# Patient Record
Sex: Female | Born: 1955 | Race: Black or African American | Hispanic: No | Smoking: Former smoker
Health system: Southern US, Community
[De-identification: ages and names within clinical notes are randomized; demographics above are authoritative.]

## PROBLEM LIST (undated history)

## (undated) DIAGNOSIS — G5600 Carpal tunnel syndrome, unspecified upper limb: Secondary | ICD-10-CM

## (undated) DIAGNOSIS — T7840XA Allergy, unspecified, initial encounter: Secondary | ICD-10-CM

## (undated) DIAGNOSIS — K279 Peptic ulcer, site unspecified, unspecified as acute or chronic, without hemorrhage or perforation: Secondary | ICD-10-CM

## (undated) DIAGNOSIS — E7800 Pure hypercholesterolemia, unspecified: Secondary | ICD-10-CM

## (undated) HISTORY — PX: TOE SURGERY: SHX1073

## (undated) HISTORY — PX: ABDOMINAL HYSTERECTOMY: SHX81

## (undated) HISTORY — DX: Peptic ulcer, site unspecified, unspecified as acute or chronic, without hemorrhage or perforation: K27.9

## (undated) HISTORY — DX: Allergy, unspecified, initial encounter: T78.40XA

---

## 2009-09-27 ENCOUNTER — Emergency Department (HOSPITAL_BASED_OUTPATIENT_CLINIC_OR_DEPARTMENT_OTHER): Admission: EM | Admit: 2009-09-27 | Discharge: 2009-09-27 | Payer: Self-pay | Admitting: Emergency Medicine

## 2009-09-27 ENCOUNTER — Ambulatory Visit: Payer: Self-pay | Admitting: Radiology

## 2012-06-06 LAB — HM PAP SMEAR

## 2012-09-06 ENCOUNTER — Emergency Department (HOSPITAL_BASED_OUTPATIENT_CLINIC_OR_DEPARTMENT_OTHER): Payer: 59

## 2012-09-06 ENCOUNTER — Emergency Department (HOSPITAL_BASED_OUTPATIENT_CLINIC_OR_DEPARTMENT_OTHER)
Admission: EM | Admit: 2012-09-06 | Discharge: 2012-09-06 | Disposition: A | Discharge status: Home / Self Care | Payer: 59 | Attending: Emergency Medicine | Admitting: Emergency Medicine

## 2012-09-06 ENCOUNTER — Encounter (HOSPITAL_BASED_OUTPATIENT_CLINIC_OR_DEPARTMENT_OTHER): Payer: Self-pay | Admitting: *Deleted

## 2012-09-06 DIAGNOSIS — M5416 Radiculopathy, lumbar region: Secondary | ICD-10-CM

## 2012-09-06 DIAGNOSIS — Z87891 Personal history of nicotine dependence: Secondary | ICD-10-CM | POA: Insufficient documentation

## 2012-09-06 DIAGNOSIS — IMO0002 Reserved for concepts with insufficient information to code with codable children: Secondary | ICD-10-CM | POA: Insufficient documentation

## 2012-09-06 DIAGNOSIS — G5600 Carpal tunnel syndrome, unspecified upper limb: Secondary | ICD-10-CM | POA: Insufficient documentation

## 2012-09-06 DIAGNOSIS — Z8639 Personal history of other endocrine, nutritional and metabolic disease: Secondary | ICD-10-CM | POA: Insufficient documentation

## 2012-09-06 DIAGNOSIS — Z862 Personal history of diseases of the blood and blood-forming organs and certain disorders involving the immune mechanism: Secondary | ICD-10-CM | POA: Insufficient documentation

## 2012-09-06 DIAGNOSIS — Z8711 Personal history of peptic ulcer disease: Secondary | ICD-10-CM | POA: Insufficient documentation

## 2012-09-06 DIAGNOSIS — M5412 Radiculopathy, cervical region: Secondary | ICD-10-CM | POA: Insufficient documentation

## 2012-09-06 HISTORY — DX: Carpal tunnel syndrome, unspecified upper limb: G56.00

## 2012-09-06 HISTORY — DX: Pure hypercholesterolemia, unspecified: E78.00

## 2012-09-06 MED ORDER — METHYLPREDNISOLONE 4 MG PO KIT: PACK | ORAL | Status: DC

## 2012-09-06 MED ORDER — MELOXICAM 7.5 MG PO TABS: 7.5000 mg | ORAL_TABLET | Freq: Every day | ORAL | Status: DC

## 2012-09-06 NOTE — ED Provider Notes (Signed)
History     CSN: [id number]  Arrival date & time 09/06/12  1031   First MD Initiated Contact with Patient 09/06/12 1046      Chief Complaint  Patient presents with  . Claudication    (Consider location/radiation/quality/duration/timing/severity/associated sxs/prior treatment) Patient is a 57 y.o. female presenting with leg pain. The history is provided by the patient.  Leg Pain Location:  Leg Time since incident:  12 hours Injury: no   Leg location:  R leg and R lower leg Pain details:    Quality:  Aching, pressure and tingling   Radiates to: to the toes and foot.   Severity:  Moderate   Onset quality:  Gradual   Duration:  12 hours   Timing:  Constant   Progression:  Worsening Chronicity:  New Prior injury to area:  No Relieved by:  Nothing Worsened by:  Bearing weight Ineffective treatments:  Elevation, movement and rest Associated symptoms: tingling   Associated symptoms: no back pain, no decreased ROM, no fever, no muscle weakness, no neck pain and no swelling     Past Medical History  Diagnosis Date  . High cholesterol   . Carpal tunnel syndrome     Past Surgical History  Procedure Laterality Date  . Abdominal hysterectomy    . Toe surgery      R foot    History reviewed. No pertinent family history.  History  Substance Use Topics  . Smoking status: Never Smoker   . Smokeless tobacco: Not on file  . Alcohol Use: Yes    OB History   Grav Para Term Preterm Abortions TAB SAB Ect Mult Living                  Review of Systems  Constitutional: Negative for fever.  HENT: Negative for neck pain.   Musculoskeletal: Negative for back pain.  All other systems reviewed and are negative.    Allergies  Review of patient's allergies indicates no known allergies.  Home Medications   Current Outpatient Rx  Name  Route  Sig  Dispense  Refill  . Multiple Vitamin (MULTIVITAMIN) tablet   Oral   Take 1 tablet by mouth daily.           BP 142/94   Pulse 85  Temp(Src) 98.3 F (36.8 C) (Oral)  Resp 16  Ht 5\' 8"  (1.727 m)  Wt 230 lb (104.327 kg)  BMI 34.98 kg/m2  SpO2 100%  Physical Exam  Nursing note and vitals reviewed. Constitutional: She is oriented to person, place, and time. She appears well-developed and well-nourished. No distress.  Pt upset and tearful on exam states she is just scared  HENT:  Head: Normocephalic and atraumatic.  Mouth/Throat: Oropharynx is clear and moist.  Eyes: Conjunctivae and EOM are normal. Pupils are equal, round, and reactive to light.  Neck: Normal range of motion. Neck supple.  Cardiovascular: Normal rate, regular rhythm and intact distal pulses.   No murmur heard. Pulmonary/Chest: Effort normal and breath sounds normal. No respiratory distress. She has no wheezes. She has no rales.  Abdominal: Soft. She exhibits no distension. There is no tenderness. There is no rebound and no guarding.  Musculoskeletal: Normal range of motion. She exhibits no edema and no tenderness.       Lumbar back: Normal.  Wrist brace on the right wrist for carpal tunnel  Neurological: She is alert and oriented to person, place, and time. She has normal strength. A sensory deficit is present.  Mild subjective decreased sensation in the right leg below the calf  Skin: Skin is warm and dry. No rash noted. No erythema.  Psychiatric: She has a normal mood and affect. Her behavior is normal.    ED Course  Procedures (including critical care time)  Labs Reviewed - No data to display Dg Lumbar Spine Complete  09/06/2012   *RADIOLOGY REPORT*  Clinical Data: Right lower leg pain/numbness  LUMBAR SPINE - COMPLETE 4+ VIEW  Comparison: None.  Findings: Five lumbar-type vertebral bodies.  Normal lumbar lordosis.  No evidence of fracture or dislocation.  Vertebral body heights are maintained.  Mild to moderate multilevel degenerative changes.  Visualized bony pelvis appears intact.  IMPRESSION: No fracture or dislocation is seen.   Mild to moderate multilevel degenerative changes.   Original Report Authenticated By: Charline Bills, M.D.   Ct Cervical Spine Wo Contrast  09/06/2012   *RADIOLOGY REPORT*  Clinical Data: Neck/right arm pain, right leg numbness  CT CERVICAL SPINE WITHOUT CONTRAST  Technique:  Multidetector CT imaging of the cervical spine was performed. Multiplanar CT image reconstructions were also generated.  Comparison: None.  Findings: Reversal of the normal cervical lordosis.  No evidence of fracture or dislocation.  Vertebral body heights are maintained.  The dens appears intact.  No prevertebral soft tissue swelling.  Moderate multilevel degenerative changes, most prominent at C5-6 and C6-7.  Osteophytic spurring which indents the right anterior thecal sac at C6 (series 2/image 52).  Visualized thyroid is unremarkable.  Small bilateral cervical lymph nodes measuring up to 7 mm short axis, likely reactive.  Visualized lung apices are clear.  IMPRESSION: No fracture or dislocation is seen.  Moderate multilevel degenerative changes, most prominent C5-6 and C6-7.  Osteophytic spurring which indents the right anterior thecal sac at C6.   Original Report Authenticated By: Charline Bills, M.D.   US Venous Img Lower Unilateral Right  09/06/2012   *RADIOLOGY REPORT*  Clinical Data: LEG NUMBNESS FOR APPROXIMATELY 1 YEAR.  RIGHT LOWER EXTREMITY VENOUS DUPLEX ULTRASOUND  Technique: Gray-scale sonography with compression, as well as color and duplex ultrasound, were performed to evaluate the deep venous system from the level of the common femoral vein through the popliteal and proximal calf veins.  Comparison: None  Findings:  Normal compressibility and normal Doppler signal within the common femoral, superficial femoral and popliteal veins, down to the proximal calf veins.  No grayscale filling defects to suggest DVT.  IMPRESSION: No evidence of right lower extremity deep vein thrombosis.   Original Report Authenticated By: Jeronimo Greaves, M.D.     1. Cervical radiculopathy at C6   2. Lumbar radiculopathy, acute       MDM   Patient presenting with right calf pain and leg numbness that started last night and has continued despite rubbing her leg, changing positions or elevation. She denies any risk factors for DVT but does have calf tenderness. Exam she has slight decreased sensation on the right subjectively compared to the left. Normal strength bilaterally she denies any bowel or bladder issues and denies any prior history of back problems. Patient also has been having issues with her right upper arm with neck pain and pain radiating down her arm. She said she was told she had carpal tunnel however she's had a lot of arm pain as well. She's not describing any numbness or tingling in her arm or her face. She is able to ambulate without difficulty. Or signs concerning for stroke at this time however concern for  lumbar radiculopathy versus DVT. Also given her neck complaints we'll do a CT of her neck to evaluate for degenerative changes.  CT C-spine, lumbar films and duplex of the right lower strandy pending. At this time patient did not want any pain medication and refused ibuprofen  12:25 PM NO DVT.  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  Pt given medrol dose pack and meloxicam and f/u.      Gwyneth Sprout, MD 09/06/12 1226

## 2012-09-06 NOTE — ED Notes (Signed)
patinet c/o R leg calf pain since this  morning

## 2012-09-10 ENCOUNTER — Other Ambulatory Visit: Payer: Self-pay | Admitting: Family Medicine

## 2012-09-10 ENCOUNTER — Encounter: Payer: Self-pay | Admitting: Family Medicine

## 2012-09-10 ENCOUNTER — Ambulatory Visit (INDEPENDENT_AMBULATORY_CARE_PROVIDER_SITE_OTHER): Payer: 59 | Admitting: Family Medicine

## 2012-09-10 VITALS — BP 138/83 | HR 74 | Wt 226.0 lb

## 2012-09-10 DIAGNOSIS — Z1231 Encounter for screening mammogram for malignant neoplasm of breast: Secondary | ICD-10-CM

## 2012-09-10 DIAGNOSIS — M542 Cervicalgia: Secondary | ICD-10-CM

## 2012-09-10 DIAGNOSIS — E559 Vitamin D deficiency, unspecified: Secondary | ICD-10-CM

## 2012-09-10 DIAGNOSIS — E785 Hyperlipidemia, unspecified: Secondary | ICD-10-CM

## 2012-09-10 DIAGNOSIS — R7301 Impaired fasting glucose: Secondary | ICD-10-CM

## 2012-09-10 DIAGNOSIS — L709 Acne, unspecified: Secondary | ICD-10-CM

## 2012-09-10 DIAGNOSIS — R5381 Other malaise: Secondary | ICD-10-CM

## 2012-09-10 DIAGNOSIS — L708 Other acne: Secondary | ICD-10-CM

## 2012-09-10 DIAGNOSIS — N951 Menopausal and female climacteric states: Secondary | ICD-10-CM

## 2012-09-10 MED ORDER — ESTRADIOL 0.1 MG/24HR TD PTTW: 1.0000 | MEDICATED_PATCH | TRANSDERMAL | Status: DC

## 2012-09-10 MED ORDER — MELOXICAM 7.5 MG PO TABS: 7.5000 mg | ORAL_TABLET | Freq: Every day | ORAL | Status: DC

## 2012-09-10 MED ORDER — ISOTRETINOIN 20 MG PO CAPS: 20.0000 mg | ORAL_CAPSULE | Freq: Two times a day (BID) | ORAL | Status: DC

## 2012-09-10 NOTE — Patient Instructions (Addendum)
1)  Find out when you had a colonoscopy and confirm when you need a repeat one.  2)  Schedule your mammogram  3)  We'll give you the tetanus shot at your physical.  4)  Confirm the date of your last Pap smear.  We have in our record that you got it in 04/2012. Technically, according to the 2012 guidelines, you don't need another pap.  You would still need a pelvic exam to check your ovaries.  5)  Return on Monday for bloodwork. Eat an early dinner on Sunday night and then drink only water till you get your labs.   6)  Supplements to lower blood sugar (Cinnamon 1000 mg plus Chromium 1000 mcg) per day.    7)  After you get your mammogram, you can start on an estrogen patch.  Apply 1 to clean, dry skin twice a week.  A supplement that you could try is [place] (OTC) Black Cohosh.    8)  Start on the Accutane 20 mg twice a day.  You will want to keep your lips moist (Carmex, Lypsyl, Alba Lip Balm)      Menopause Menopause is the normal time of life when menstrual periods stop completely. Menopause is complete when you have missed 12 consecutive menstrual periods. It usually occurs between the ages of 82 to 82, with an average age of 35. Very rarely does a woman develop menopause before 55 years old. At menopause, your ovaries stop producing the female hormones, estrogen and progesterone. This can cause undesirable symptoms and also affect your health. Sometimes the symptoms may occur 4 to 5 years before the menopause begins. There is no relationship between menopause and:  Oral contraceptives.  Number of children you had.  Race.  The age your menstrual periods started (menarche). Heavy smokers and very thin women may develop menopause earlier in life. CAUSES  The ovaries stop producing the female hormones estrogen and progesterone.  Other causes include:  Surgery to remove both ovaries.  The ovaries stop functioning for no known reason.  Tumors of the pituitary gland in the  brain.  Medical disease that affects the ovaries and hormone production.  Radiation treatment to the abdomen or pelvis.  Chemotherapy that affects the ovaries. SYMPTOMS   Hot flashes.  Night sweats.  Decrease in sex drive.  Vaginal dryness and thinning of the vagina causing painful intercourse.  Dryness of the skin and developing wrinkles.  Headaches.  Tiredness.  Irritability.  Memory problems.  Weight gain.  Bladder infections.  Hair growth of the face and chest.  Infertility. More serious symptoms include:  Loss of bone (osteoporosis) causing breaks (fractures).  Depression.  Hardening and narrowing of the arteries (atherosclerosis) causing heart attacks and strokes. DIAGNOSIS   When the menstrual periods have stopped for 12 straight months.  Physical exam.  Hormone studies of the blood. TREATMENT  There are many treatment choices and nearly as many questions about them. The decisions to treat or not to treat menopausal changes is an individual choice made with your caregiver. Your caregiver can discuss the treatments with you. Together, you can decide which treatment will work best for you. Your treatment choices may include:   Hormone therapy (estorgen and progesterone).  Non-hormonal medications.  Treating the individual symptoms with medication (for example antidepressants for depression).  Herbal medications that may help specific symptoms.  Counseling by a psychiatrist or psychologist.  Group therapy.  Lifestyle changes including:  Eating healthy.  Regular exercise.  Limiting caffeine  and alcohol.  Stress management and meditation.  No treatment. HOME CARE INSTRUCTIONS   Take the medication your caregiver gives you as directed.  Get plenty of sleep and rest.  Exercise regularly.  Eat a diet that contains calcium (good for the bones) and soy products (acts like estrogen hormone).  Avoid alcoholic beverages.  Do not  smoke.  If you have hot flashes, dress in layers.  Take supplements, calcium and vitamin D to strengthen bones.  You can use over-the-counter lubricants or moisturizers for vaginal dryness.  Group therapy is sometimes very helpful.  Acupuncture may be helpful in some cases. SEEK MEDICAL CARE IF:   You are not sure you are in menopause.  You are having menopausal symptoms and need advice and treatment.  You are still having menstrual periods after age 82.  You have pain with intercourse.  Menopause is complete (no menstrual period for 12 months) and you develop vaginal bleeding.  You need a referral to a specialist (gynecologist, psychiatrist or psychologist) for treatment. SEEK IMMEDIATE MEDICAL CARE IF:   You have severe depression.  You have excessive vaginal bleeding.  You fell and think you have a broken bone.  You have pain when you urinate.  You develop leg or chest pain.  You have a fast pounding heart beat (palpitations).  You have severe headaches.  You develop vision problems.  You feel a lump in your breast.  You have abdominal pain or severe indigestion. Document Released: 06/15/2003 Document Revised: 06/17/2011 Document Reviewed: 01/21/2008 [facility] Patient Information 2014 [place], [place].

## 2012-09-10 NOTE — Progress Notes (Signed)
  Subjective:    Patient ID: Diane Mathis, female    DOB: 1955-12-01, 57 y.o.   MRN: [id number]  HPI  Diane Mathis is here today to discuss a few issues:      1) Right Side Pain:  She was experiencing several neurological symptoms in her right side on 09/06/12. She was very concerned that she might be having a stroke so she came to the [facility] ED for evaluation.  A CT of her cervical spine was done along with an x-ray of her lumbar spine.  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 She was also having pain in her leg so a venous doppler was done which was negative.      2)  Hot Flashes: She continues to struggle with hot flashes.  She feels that her hot flashes have been worsening in the past few months.  She tries to wear light clothing and keeping herself well hydrated but it does not help.    Review of Systems  Endocrine: Positive for heat intolerance (Hot Flashes ).  Musculoskeletal: Positive for back pain and arthralgias (Neck Pain ).  Skin: Positive for color change.       Acne    Past Medical History  Diagnosis Date  . High cholesterol   . Carpal tunnel syndrome   . Allergy   . Peptic ulcer    Family History  Problem Relation Age of Onset  . Hyperlipidemia Mother   . Hypertension Father   . Hyperlipidemia Father   . Asthma Son   . Cancer Maternal Aunt     Breast  . Cancer Maternal Uncle     Bone  . Cancer Paternal Aunt     Breast  . 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    Alcohol Use:  Occasional   Drug Use:  None   Diet:  Regular   Exercise:  Walking    Hobbies:  Baking                    Objective:   Physical Exam  Constitutional: She appears well-nourished. No distress.  HENT:  Head: Normocephalic.  Eyes: No scleral icterus.  Neck: No thyromegaly present.  Cardiovascular: Normal rate, regular rhythm and normal heart sounds.   Pulmonary/Chest: Effort normal and breath sounds normal.  Abdominal: There is no tenderness.  Musculoskeletal: She exhibits no edema and no tenderness.  Neurological: She is alert.  Skin: Skin is warm and dry.  Acne on face and dark spots  Psychiatric: She has a normal mood and affect. Her behavior is normal. Judgment and thought content normal.          Assessment & Plan:

## 2012-09-13 DIAGNOSIS — M542 Cervicalgia: Secondary | ICD-10-CM | POA: Insufficient documentation

## 2012-09-13 DIAGNOSIS — L709 Acne, unspecified: Secondary | ICD-10-CM | POA: Insufficient documentation

## 2012-09-13 DIAGNOSIS — N951 Menopausal and female climacteric states: Secondary | ICD-10-CM | POA: Insufficient documentation

## 2012-09-13 NOTE — Assessment & Plan Note (Signed)
She was enrolled into the [employer] program.  She will start on Accutane 20 mg to take BID.  F/U in one month.

## 2012-09-13 NOTE — Assessment & Plan Note (Signed)
She is going to start on Minivelle after she gets her mammogram.

## 2012-09-13 NOTE — Assessment & Plan Note (Signed)
She was given a prescription for Mobic.

## 2012-09-14 ENCOUNTER — Other Ambulatory Visit: Payer: Self-pay | Admitting: *Deleted

## 2012-09-14 ENCOUNTER — Ambulatory Visit (HOSPITAL_BASED_OUTPATIENT_CLINIC_OR_DEPARTMENT_OTHER)
Admission: RE | Admit: 2012-09-14 | Discharge: 2012-09-14 | Disposition: A | Discharge status: Home / Self Care | Payer: 59 | Source: Ambulatory Visit | Attending: Family Medicine | Admitting: Family Medicine

## 2012-09-14 DIAGNOSIS — E785 Hyperlipidemia, unspecified: Secondary | ICD-10-CM

## 2012-09-14 DIAGNOSIS — R5383 Other fatigue: Secondary | ICD-10-CM

## 2012-09-14 DIAGNOSIS — Z1231 Encounter for screening mammogram for malignant neoplasm of breast: Secondary | ICD-10-CM | POA: Insufficient documentation

## 2012-09-14 DIAGNOSIS — E559 Vitamin D deficiency, unspecified: Secondary | ICD-10-CM

## 2012-09-14 DIAGNOSIS — R7301 Impaired fasting glucose: Secondary | ICD-10-CM

## 2012-09-14 LAB — CBC
HCT: 36.5 % (ref 36.0–46.0)
Hemoglobin: 12.4 g/dL (ref 12.0–15.0)
MCH: 27.6 pg (ref 26.0–34.0)
MCHC: 34 g/dL (ref 30.0–36.0)
MCV: 81.3 fL (ref 78.0–100.0)
Platelets: 339 10*3/uL (ref 150–400)
RBC: 4.49 MIL/uL (ref 3.87–5.11)
RDW: 14.7 % (ref 11.5–15.5)
WBC: 6.1 10*3/uL (ref 4.0–10.5)

## 2012-09-14 LAB — COMPLETE METABOLIC PANEL WITH GFR
ALT: 14 U/L (ref 0–35)
AST: 13 U/L (ref 0–37)
Albumin: 3.9 g/dL (ref 3.5–5.2)
Alkaline Phosphatase: 63 U/L (ref 39–117)
BUN: 14 mg/dL (ref 6–23)
CO2: 25 mEq/L (ref 19–32)
Calcium: 9 mg/dL (ref 8.4–10.5)
Chloride: 105 mEq/L (ref 96–112)
Creat: 0.73 mg/dL (ref 0.50–1.10)
GFR, Est African American: >89 mL/min
GFR, Est Non African American: >89 mL/min
Glucose, Bld: 90 mg/dL (ref 70–99)
Potassium: 3.8 mEq/L (ref 3.5–5.3)
Sodium: 138 mEq/L (ref 135–145)
Total Bilirubin: 0.4 mg/dL (ref 0.3–1.2)
Total Protein: 7 g/dL (ref 6.0–8.3)

## 2012-09-14 LAB — HEMOGLOBIN A1C
Hgb A1c MFr Bld: 6 % — ABNORMAL HIGH (ref <5.7)
Mean Plasma Glucose: 126 mg/dL — ABNORMAL HIGH (ref <117)

## 2012-09-14 LAB — TSH: TSH: 1.165 u[IU]/mL (ref 0.350–4.500)

## 2012-09-15 LAB — NMR LIPOPROFILE WITH LIPIDS
Cholesterol, Total: 234 mg/dL — ABNORMAL HIGH (ref <200)
HDL Particle Number: 40.1 umol/L (ref >=30.5)
HDL Size: 8.6 nm — ABNORMAL LOW (ref >=9.2)
HDL-C: 55 mg/dL (ref >=40)
LDL (calc): 165 mg/dL — ABNORMAL HIGH (ref <100)
LDL Particle Number: 2139 nmol/L — ABNORMAL HIGH (ref <1000)
LDL Size: 21.2 nm (ref >20.5)
LP-IR Score: 54 — ABNORMAL HIGH (ref <=45)
Large HDL-P: 3.6 umol/L — ABNORMAL LOW (ref >=4.8)
Large VLDL-P: 1.7 nmol/L (ref <=2.7)
Small LDL Particle Number: 661 nmol/L — ABNORMAL HIGH (ref <=527)
Triglycerides: 70 mg/dL (ref <150)
VLDL Size: 44.9 nm (ref <=46.6)

## 2012-09-15 LAB — VITAMIN D 25 HYDROXY (VIT D DEFICIENCY, FRACTURES): Vit D, 25-Hydroxy: 29 ng/mL — ABNORMAL LOW (ref 30–89)

## 2012-09-15 LAB — INSULIN, FASTING: Insulin fasting, serum: 14 u[IU]/mL (ref 3–28)

## 2012-11-16 ENCOUNTER — Encounter (HOSPITAL_BASED_OUTPATIENT_CLINIC_OR_DEPARTMENT_OTHER): Payer: Self-pay | Admitting: *Deleted

## 2012-11-16 ENCOUNTER — Emergency Department (HOSPITAL_BASED_OUTPATIENT_CLINIC_OR_DEPARTMENT_OTHER)
Admission: EM | Admit: 2012-11-16 | Discharge: 2012-11-16 | Disposition: A | Discharge status: Home / Self Care | Payer: 59 | Attending: Emergency Medicine | Admitting: Emergency Medicine

## 2012-11-16 ENCOUNTER — Emergency Department (HOSPITAL_BASED_OUTPATIENT_CLINIC_OR_DEPARTMENT_OTHER): Payer: 59

## 2012-11-16 DIAGNOSIS — Z8669 Personal history of other diseases of the nervous system and sense organs: Secondary | ICD-10-CM | POA: Insufficient documentation

## 2012-11-16 DIAGNOSIS — M6530 Trigger finger, unspecified finger: Secondary | ICD-10-CM

## 2012-11-16 DIAGNOSIS — Z8639 Personal history of other endocrine, nutritional and metabolic disease: Secondary | ICD-10-CM | POA: Insufficient documentation

## 2012-11-16 DIAGNOSIS — Z862 Personal history of diseases of the blood and blood-forming organs and certain disorders involving the immune mechanism: Secondary | ICD-10-CM | POA: Insufficient documentation

## 2012-11-16 DIAGNOSIS — Z87891 Personal history of nicotine dependence: Secondary | ICD-10-CM | POA: Insufficient documentation

## 2012-11-16 DIAGNOSIS — Z8711 Personal history of peptic ulcer disease: Secondary | ICD-10-CM | POA: Insufficient documentation

## 2012-11-16 MED ORDER — PREDNISONE 50 MG PO TABS: 50.0000 mg | ORAL_TABLET | Freq: Every day | ORAL | Status: DC

## 2012-11-16 MED ORDER — HYDROCODONE-ACETAMINOPHEN 5-325 MG PO TABS: 1.0000 | ORAL_TABLET | Freq: Four times a day (QID) | ORAL | Status: DC | PRN

## 2012-11-16 NOTE — ED Notes (Signed)
Pt c/o right 3rd finger pain/swelling w/o injury x 3 weeks

## 2012-11-16 NOTE — ED Provider Notes (Signed)
  CSN: [id number]     Arrival date & time 11/16/12  1946 History     First MD Initiated Contact with Patient 11/16/12 2039     Chief Complaint  Patient presents with  . finger pain    (Consider location/radiation/quality/duration/timing/severity/associated sxs/prior Treatment) HPI Patient presents to the emergency department with finger pain for the last 3 weeks.  Patient, states she's having pain in her right third digit.  Patient, states, that she bends it or moves it is painful.  She states that, sometimes it will get stuck when she bends it.  Patient denies numbness or weakness in the finger.  She states she did not have any injury to the finger.  Patient, states she took ibuprofen for the discomfort without significant relief. Past Medical History  Diagnosis Date  . High cholesterol   . Carpal tunnel syndrome   . Allergy   . Peptic ulcer    Past Surgical History  Procedure Laterality Date  . Abdominal hysterectomy    . Toe surgery      R foot   Family History  Problem Relation Age of Onset  . Hyperlipidemia Mother   . Hypertension Father   . Hyperlipidemia Father   . Asthma Son   . Cancer Maternal Aunt     Breast  . Cancer Maternal Uncle     Bone  . Cancer Paternal Aunt     Breast  . Diabetes Maternal Grandmother    History  Substance Use Topics  . Smoking status: Former Games developer  . Smokeless tobacco: Not on file  . Alcohol Use: Yes     Comment: Occasional   OB History   Grav Para Term Preterm Abortions TAB SAB Ect Mult Living                 Review of Systems All other systems negative except as documented in the HPI. All pertinent positives and negatives as reviewed in the HPI. Allergies  Review of patient's allergies indicates no known allergies.  Home Medications  No current outpatient prescriptions on file. BP 153/81  Pulse 100  Temp(Src) 98 F (36.7 C) (Oral)  Resp 16  Ht 5\' 8"  (1.727 m)  Wt 230 lb (104.327 kg)  BMI 34.98 kg/m2  SpO2  100% Physical Exam  Nursing note and vitals reviewed. Constitutional: She is oriented to person, place, and time. She appears well-developed and well-nourished. No distress.  Musculoskeletal:       Hands: Neurological: She is alert and oriented to person, place, and time.  Skin: Skin is warm and dry.    ED Course   Procedures (including critical care time)  Labs Reviewed - No data to display Dg Finger Middle Right  11/16/2012   *RADIOLOGY REPORT*  Clinical Data: Pain and swelling  RIGHT MIDDLE FINGER 2+V  Comparison: None.  Findings: Frontal, oblique, and lateral views were obtained.  There is generalized soft tissue swelling.  There is no fracture or dislocation.  No erosive change or periostitis.  Joint spaces appear intact.  There is no intra-articular calcifications.  IMPRESSION: Soft tissue swelling. Study otherwise unremarkable.   Original Report Authenticated By: Bretta Bang, M.D.   patient will be treated for her trigger finger, L. centimeter to the orthopedic hand surgeon.  She is told ice the area.  Told to return here as needed.  MDM    Carlyle Dolly, PA-C 11/16/12 2105

## 2012-11-16 NOTE — ED Provider Notes (Signed)
Medical screening examination/treatment/procedure(s) were performed by non-physician practitioner and as supervising physician I was immediately available for consultation/collaboration.   Cauy Melody, MD 11/16/12 2243 

## 2012-11-17 ENCOUNTER — Other Ambulatory Visit: Payer: 59

## 2012-11-20 ENCOUNTER — Ambulatory Visit: Payer: Self-pay | Admitting: Family Medicine

## 2012-11-24 ENCOUNTER — Ambulatory Visit: Payer: Self-pay | Admitting: Family Medicine

## 2012-12-03 ENCOUNTER — Ambulatory Visit (INDEPENDENT_AMBULATORY_CARE_PROVIDER_SITE_OTHER): Payer: 59 | Admitting: Family Medicine

## 2012-12-03 ENCOUNTER — Encounter: Payer: Self-pay | Admitting: Family Medicine

## 2012-12-03 VITALS — BP 129/85 | HR 79 | Wt 232.0 lb

## 2012-12-03 DIAGNOSIS — E785 Hyperlipidemia, unspecified: Secondary | ICD-10-CM

## 2012-12-03 DIAGNOSIS — E88819 Insulin resistance, unspecified: Secondary | ICD-10-CM

## 2012-12-03 DIAGNOSIS — N951 Menopausal and female climacteric states: Secondary | ICD-10-CM

## 2012-12-03 DIAGNOSIS — E8881 Metabolic syndrome: Secondary | ICD-10-CM

## 2012-12-03 DIAGNOSIS — Z23 Encounter for immunization: Secondary | ICD-10-CM

## 2012-12-03 MED ORDER — SIMVASTATIN 20 MG PO TABS: 20.0000 mg | ORAL_TABLET | Freq: Every evening | ORAL | Status: DC

## 2012-12-03 MED ORDER — METFORMIN HCL 500 MG PO TABS: ORAL_TABLET | ORAL | Status: AC

## 2012-12-03 NOTE — Progress Notes (Signed)
  Subjective:    Patient ID: Diane Mathis, female    DOB: 05-26-1955, 57 y.o.   MRN: [id number]  HPI  Deitra is here today to go over her most recent lab results, to discuss the conditions listed below and to get refills on her medications.   1)  Night Sweats:  She used the samples of Minivelle patches but did not feel that they helped her night sweats very much.    2)  Arthritis:  Her right middle finger continues to be painful.  She went to [facility] ED because she felt that her finger was deviated too much to one side.   She was given Vicodin and a round of Prednisone which she completed.  She noted improvement while on steroids but it is starting to bother her.      Review of Systems  Constitutional: Negative.   HENT: Negative.   Eyes: Negative.   Respiratory: Negative.   Cardiovascular: Negative.   Gastrointestinal: Negative.   Endocrine: Negative.   Genitourinary: Negative.   Musculoskeletal: Positive for arthralgias.       Right middle finger   Skin: Negative.   Allergic/Immunologic: Negative.   Neurological: Negative.   Hematological: Negative.   Psychiatric/Behavioral: Negative.     Past Medical History  Diagnosis Date  . High cholesterol   . Carpal tunnel syndrome   . Allergy   . Peptic ulcer     Family History  Problem Relation Age of Onset  . Hyperlipidemia Mother   . Hypertension Father   . Hyperlipidemia Father   . Asthma Son   . Cancer Maternal Aunt     Breast  . Cancer Maternal Uncle     Bone  . Cancer Paternal Aunt     Breast  . 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    Alcohol Use:  Occasional    Drug Use:  None   Diet:  Regular   Exercise:  Walking    Hobbies:  Baking                Objective:   Physical Exam  Vitals reviewed. Constitutional: She is oriented to person, place, and time. She appears well-developed and well-nourished.  Cardiovascular: Normal rate and regular rhythm.   Pulmonary/Chest: Effort normal and breath sounds normal.  Musculoskeletal: She exhibits edema and tenderness.  Right Middle Finger   Neurological: She is alert and oriented to person, place, and time.  Skin: Skin is warm and dry.  Psychiatric: She has a normal mood and affect.          Assessment & Plan:

## 2012-12-03 NOTE — Patient Instructions (Addendum)
1)  Vitamins  Multivitamin (One A Day, Alive, Centrum)   Calcium - 1200 -1500 mg per day  Vitamin D3 - 5000 IU mg per day Cinnamon - 1000 mg per day Chromium - 1000 mcg per day 4)  Hot Flashes  Try adding Estrovan (Energy) to the patch.      Insulin Resistance Blood sugar (glucose) levels are controlled by a hormone called insulin. Insulin is made by your pancreas. When your blood glucose goes up, insulin is released into your blood. Insulin is required for your body to function normally. However, your body can become resistant to your own insulin or to insulin given to treat diabetes. In either case, insulin resistance can lead to serious problems. These problems include:  Type 2 diabetes.  Heart disease.  High blood pressure.  Stroke.  Polycystic ovary syndrome.  Fatty liver. CAUSES  Insulin resistance can develop for many different reasons. It is more likely to happen in people with these conditions or characteristics:  Obesity.  Inactivity.  Pregnancy.  High blood pressure.  Stress.  Steroid use.  Infection or severe illness.  Increased levels of cholesterol and triglycerides. SYMPTOMS  There are no symptoms. You may have symptoms related to the various complications of insulin resistance.  DIAGNOSIS  Several different things can make your caregiver suspect you have insulin resistance. These include:  High blood glucose (hyperglycemia).  Abnormal cholesterol levels.  High uric acid levels.  Changes related to blood pressure.  Changes related to inflammation. Insulin resistance can be determined with blood tests. An elevated insulin level when you have not eaten might suggest resistance. Other more complicated tests are sometimes necessary. TREATMENT  Lifestyle changes are the most important treatment for insulin resistance.   If you are overweight and you have insulin resistance, you can improve your insulin sensitivity by losing weight.  Moderate  exercise for 30 40 minutes, 4 days a week, can improve insulin sensitivity. Some medicines can also help improve your insulin sensitivity. Your caregiver can discuss these with you if they are appropriate.  HOME CARE INSTRUCTIONS   Do not smoke.  Keep your weight at a healthy level.  Get exercise.  If you have diabetes, follow your caregiver's directions.  If you have high blood pressure, follow your caregiver's directions.  Only take prescription medicines for pain, fever, or discomfort as directed by your caregiver. SEEK MEDICAL CARE IF:   You are diabetic and you are having problems keeping your blood glucose levels at target range.  You are having episodes of low blood glucose (hypoglycemia).  You feel you might be having side effects from your medicines.  You have symptoms of an illness that is not improving after 3 4 days.  You have a sore or wound that is not healing.  You notice a change in vision or a new problem with your vision. SEEK IMMEDIATE MEDICAL CARE IF:   Your blood glucose goes below 70, especially if you have confusion, lightheadedness, or other symptoms with it.  Your blood glucose is very high (as advised by your caregiver) twice in a row.  You pass out.  You have chest pain or trouble breathing.  You have a sudden, severe headache.  You have sudden weakness in one arm or one leg.  You have sudden difficulty speaking or swallowing.  You develop vomiting or diarrhea that is getting worse or not improving after 1 day. Document Released: 05/14/2005 Document Revised: 09/24/2011 Document Reviewed: 05/12/2008 [facility] Patient Information 2014 [place], [place].

## 2013-01-10 ENCOUNTER — Encounter: Payer: Self-pay | Admitting: Family Medicine

## 2013-01-29 DIAGNOSIS — E785 Hyperlipidemia, unspecified: Secondary | ICD-10-CM | POA: Insufficient documentation

## 2013-01-29 DIAGNOSIS — Z23 Encounter for immunization: Secondary | ICD-10-CM | POA: Insufficient documentation

## 2013-01-29 DIAGNOSIS — E8881 Metabolic syndrome: Secondary | ICD-10-CM | POA: Insufficient documentation

## 2013-01-29 DIAGNOSIS — E88819 Insulin resistance, unspecified: Secondary | ICD-10-CM | POA: Insufficient documentation

## 2013-01-29 NOTE — Assessment & Plan Note (Signed)
She was given a prescription for metformin.

## 2013-01-29 NOTE — Assessment & Plan Note (Signed)
She was given a prescription for Zocor.

## 2013-01-29 NOTE — Assessment & Plan Note (Signed)
The patient confirmed that they are not allergic to eggs and have never had a bad reaction with the flu shot in the past.  The vaccination was given without difficulty.   

## 2013-01-29 NOTE — Assessment & Plan Note (Signed)
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

## 2013-05-20 ENCOUNTER — Other Ambulatory Visit: Payer: Self-pay | Admitting: Family Medicine

## 2013-05-21 NOTE — Telephone Encounter (Signed)
Patient has an appt scheduled on 06/21/2013. PG

## 2013-06-07 ENCOUNTER — Other Ambulatory Visit: Payer: Self-pay | Admitting: *Deleted

## 2013-06-07 DIAGNOSIS — R7301 Impaired fasting glucose: Secondary | ICD-10-CM

## 2013-06-07 DIAGNOSIS — E785 Hyperlipidemia, unspecified: Secondary | ICD-10-CM

## 2013-06-07 DIAGNOSIS — R5381 Other malaise: Secondary | ICD-10-CM

## 2013-06-07 DIAGNOSIS — R5383 Other fatigue: Secondary | ICD-10-CM

## 2013-06-08 ENCOUNTER — Other Ambulatory Visit: Payer: 59

## 2013-06-10 ENCOUNTER — Other Ambulatory Visit: Payer: Self-pay | Admitting: *Deleted

## 2013-06-10 DIAGNOSIS — E785 Hyperlipidemia, unspecified: Secondary | ICD-10-CM

## 2013-06-10 DIAGNOSIS — E119 Type 2 diabetes mellitus without complications: Secondary | ICD-10-CM

## 2013-06-11 ENCOUNTER — Other Ambulatory Visit: Payer: 59

## 2013-06-11 LAB — COMPLETE METABOLIC PANEL WITH GFR
ALT: 13 U/L (ref 0–35)
AST: 15 U/L (ref 0–37)
Albumin: 4.2 g/dL (ref 3.5–5.2)
Alkaline Phosphatase: 66 U/L (ref 39–117)
BUN: 13 mg/dL (ref 6–23)
CO2: 24 mEq/L (ref 19–32)
Calcium: 9.3 mg/dL (ref 8.4–10.5)
Chloride: 105 mEq/L (ref 96–112)
Creat: 0.67 mg/dL (ref 0.50–1.10)
GFR, Est African American: >89 mL/min
GFR, Est Non African American: >89 mL/min
Glucose, Bld: 96 mg/dL (ref 70–99)
Potassium: 4 mEq/L (ref 3.5–5.3)
Sodium: 139 mEq/L (ref 135–145)
Total Bilirubin: 0.4 mg/dL (ref 0.2–1.2)
Total Protein: 7.1 g/dL (ref 6.0–8.3)

## 2013-06-11 LAB — LIPID PANEL
Cholesterol: 265 mg/dL — ABNORMAL HIGH (ref 0–200)
HDL: 61 mg/dL (ref >39)
LDL Cholesterol: 183 mg/dL — ABNORMAL HIGH (ref 0–99)
Total CHOL/HDL Ratio: 4.3 Ratio
Triglycerides: 105 mg/dL (ref <150)
VLDL: 21 mg/dL (ref 0–40)

## 2013-06-11 LAB — HEMOGLOBIN A1C
Hgb A1c MFr Bld: 6.1 % — ABNORMAL HIGH (ref <5.7)
Mean Plasma Glucose: 128 mg/dL — ABNORMAL HIGH (ref <117)

## 2013-06-11 LAB — CBC WITH DIFFERENTIAL/PLATELET
Basophils Absolute: 0 10*3/uL (ref 0.0–0.1)
Basophils Relative: 0 % (ref 0–1)
Eosinophils Absolute: 0.2 10*3/uL (ref 0.0–0.7)
Eosinophils Relative: 6 % — ABNORMAL HIGH (ref 0–5)
HCT: 36.9 % (ref 36.0–46.0)
Hemoglobin: 12.4 g/dL (ref 12.0–15.0)
Lymphocytes Relative: 44 % (ref 12–46)
Lymphs Abs: 1.8 10*3/uL (ref 0.7–4.0)
MCH: 27.4 pg (ref 26.0–34.0)
MCHC: 33.6 g/dL (ref 30.0–36.0)
MCV: 81.5 fL (ref 78.0–100.0)
Monocytes Absolute: 0.4 10*3/uL (ref 0.1–1.0)
Monocytes Relative: 10 % (ref 3–12)
Neutro Abs: 1.6 10*3/uL — ABNORMAL LOW (ref 1.7–7.7)
Neutrophils Relative %: 40 % — ABNORMAL LOW (ref 43–77)
Platelets: 303 10*3/uL (ref 150–400)
RBC: 4.53 MIL/uL (ref 3.87–5.11)
RDW: 14.5 % (ref 11.5–15.5)
WBC: 4 10*3/uL (ref 4.0–10.5)

## 2013-06-21 ENCOUNTER — Ambulatory Visit (INDEPENDENT_AMBULATORY_CARE_PROVIDER_SITE_OTHER): Payer: 59 | Admitting: Family Medicine

## 2013-06-21 ENCOUNTER — Encounter: Payer: Self-pay | Admitting: Family Medicine

## 2013-06-21 VITALS — BP 150/88 | HR 77 | Resp 16 | Wt 232.0 lb

## 2013-06-21 DIAGNOSIS — R7301 Impaired fasting glucose: Secondary | ICD-10-CM

## 2013-06-21 DIAGNOSIS — R030 Elevated blood-pressure reading, without diagnosis of hypertension: Secondary | ICD-10-CM

## 2013-06-21 DIAGNOSIS — E785 Hyperlipidemia, unspecified: Secondary | ICD-10-CM

## 2013-06-21 DIAGNOSIS — R232 Flushing: Secondary | ICD-10-CM

## 2013-06-21 DIAGNOSIS — IMO0001 Reserved for inherently not codable concepts without codable children: Secondary | ICD-10-CM

## 2013-06-21 MED ORDER — METFORMIN HCL 1000 MG PO TABS: 1000.0000 mg | ORAL_TABLET | Freq: Two times a day (BID) | ORAL | Status: AC

## 2013-06-21 MED ORDER — SIMVASTATIN 40 MG PO TABS: 40.0000 mg | ORAL_TABLET | Freq: Every day | ORAL | Status: AC

## 2013-06-21 MED ORDER — CLONIDINE HCL 0.1 MG PO TABS: 0.1000 mg | ORAL_TABLET | Freq: Two times a day (BID) | ORAL | Status: AC

## 2013-06-21 NOTE — Patient Instructions (Signed)
1)  Cholesterol - Take the Zocor (simvastatin) 40 mg at bedtime.  We will recheck your level in 5 1/2 months.    2)  Blood Sugar - We are increasing your metformin to 1000 mg twice a day.  Try to limit your bad carbs (rice, potatoes, pasta, sweets) and increase your exercise.    3)  Menopause - Try the Estrovan at night along with soy products (Revival Soy).  We are also going to try some Clonidine which will help BP and hot flashes.      Menopause and Herbal Products Menopause is the normal time of life when menstrual periods stop completely. Menopause is complete when you have missed 12 consecutive menstrual periods. It usually occurs between the ages of 65 to 71, with an average age of 55. Very rarely does a woman develop menopause before 56 years old. At menopause, your ovaries stop producing the female hormones, estrogen and progesterone. This can cause undesirable symptoms and also affect your health. Sometimes the symptoms can occur 4 to 5 years before the menopause begins. There is no relationship between menopause and:  Oral contraceptives.  Number of children you had.  Race.  The age your menstrual periods started (menarche). Heavy smokers and very thin women may develop menopause earlier in life. Estrogen and progesterone hormone treatment is the usual method of treating menopausal symptoms. However, there are women who should not take hormone treatment. This is true of:   Women that have breast or uterine cancer.  Women who prefer not to take hormones because of certain side effects (abnormal uterine bleeding).  Women who are afraid that hormones may cause breast cancer.  Women who have a history of liver disease, heart disease, stroke, or blood clots. For these women, there are other medications that may help treat their menopausal symptoms. These medications are found in plants and botanical products. They can be found in the form of herbs, teas, oils, tinctures, and pills.   CAUSES:  The ovaries stop producing the female hormones estrogen and progesterone.  Other causes include:  Surgery to remove both ovaries.  The ovaries stop functioning for no know reason.  Tumors of the pituitary gland in the brain.  Medical disease that affects the ovaries and hormone production.  Radiation treatment to the abdomen or pelvis.  Chemotherapy that affects the ovaries. PHYTOESTROGENS: Phytoestrogens occur naturally in plants and plant products. They act like estrogen in the body. Herbal medications are made from these plants and botanical steroids. There are 3 types of phytoestrogens:  Isoflavones (genistein and daidzein) are found in soy, garbanzo beans, miso and tofu foods.  Ligins are found in the shell of seeds. They are used to make oils like flaxseed oil. The bacteria in your intestine act on these foods to produce the estrogen-like hormones.  Coumestans are estrogen-like. Some of the foods they are found in include sunflower seeds and bean sprouts. CONDITIONS AND THEIR POSSIBLE HERBAL TREATMENT:  Hot flashes and night sweats.  Soy, black cohosh and evening primrose.  Irritability, insomnia, depression and memory problems.  Chasteberry, ginseng, and soy.  St. John's wort may be helpful for depression. However, there is a concern of it causing cataracts of the eye and may have bad effects on other medications. St. John's wort should not be taken for long time and without your caregiver's advice.  Loss of libido and vaginal and skin dryness.  Wild yam and soy.  Prevention of coronary heart disease and osteoporosis.  Soy and  Isoflavones. Several studies have shown that some women benefit from herbal medications, but most of the studies have not consistently shown that these supplements are much better than placebo. Other forms of treatment to help women with menopausal symptoms include a balanced diet, rest, exercise, vitamin and calcium (with vitamin  D) supplements, acupuncture, and group therapy when necessary. THOSE WHO SHOULD NOT TAKE HERBAL MEDICATIONS INCLUDE:  Women who are planning on getting pregnant unless told by your caregiver.  Women who are breastfeeding unless told by your caregiver.  Women who are taking other prescription medications unless told by your caregiver.  Infants, children, and elderly women unless told by your caregiver. Different herbal medications have different and unmeasured amounts of the herbal ingredients. There are no regulations, quality control, and standardization of the ingredients in herbal medications. Therefore, the amount of the ingredient in the medication may vary from one herb, pill, tea, oil or tincture to another. Many herbal medications can cause serious problems and can even have poisonous effects if taken too much or too long. If problems develop, the medication should be stopped and recorded by your caregiver. HOME CARE INSTRUCTIONS  Do not take or give children herbal medications without your caregiver's advice.  Let your caregiver know all the medications you are taking. This includes prescription, over-the-counter, eye drops, and creams.  Do not take herbal medications longer or more than recommended.  Tell your caregiver about any side effects from the medication. SEEK MEDICAL CARE IF:  You develop a fever of 102 F (38.9 C), or as directed by your caregiver.  You feel sick to your stomach (nauseous), vomit, or have diarrhea.  You develop a rash.  You develop abdominal pain.  You develop severe headaches.  You start to have vision problems.  You feel dizzy or faint.  You start to feel numbness in any part of your body.  You start shaking (have convulsions). Document Released: 09/11/2007 Document Revised: 03/11/2012 Document Reviewed: 04/10/2010 [facility] Patient Information 2014 [place], [place].

## 2013-06-21 NOTE — Progress Notes (Signed)
Subjective:    Patient ID: Diane Mathis, female    DOB: 07-Oct-1955, 58 y.o.   MRN: [id number]  HPI  Diane Mathis is here today to go over her most recent lab results and to discuss the conditions listed below:  1)  Hyperlipidemia - She has not taken simvastatin for several months.  She ran out of it and was unsure if she needed to continue on it.    2)  IFG - She has been taking metformin (1000 mg) at bedtime.     3)  Hypertension - She has been experiencing some vision problems for the past four weeks.  She checked her blood pressure at work and it was elevated.    4)  Menopause - She continues to have hot flashes.     Review of Systems  Constitutional: Negative for activity change, fatigue and unexpected weight change.  HENT: Negative.   Eyes: Positive for visual disturbance.  Respiratory: Negative for shortness of breath.   Cardiovascular: Negative for chest pain, palpitations and leg swelling.  Gastrointestinal: Negative for diarrhea and constipation.  Endocrine: Positive for heat intolerance (Hot Flashes).  Genitourinary: Negative for difficulty urinating.  Musculoskeletal: Negative.   Skin: Negative.   Neurological: Positive for light-headedness.  Hematological: Negative for adenopathy. Does not bruise/bleed easily.  Psychiatric/Behavioral: Negative for sleep disturbance and dysphoric mood. The patient is not nervous/anxious.      Past Medical History  Diagnosis Date  . High cholesterol   . Carpal tunnel syndrome   . Allergy   . Peptic ulcer      Past Surgical History  Procedure Laterality Date  . Abdominal hysterectomy    . 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    Alcohol Use:  Occasional   Drug Use:  None   Diet:  Regular   Exercise:  Walking    Hobbies:  Baking               Family History  Problem Relation Age of Onset  . Hyperlipidemia Mother   . Hypertension Father   . Hyperlipidemia Father   . Asthma Son   . Cancer Maternal Aunt     Breast  . Cancer Maternal Uncle     Bone  . Cancer Paternal Aunt     Breast  . Diabetes Maternal Grandmother      Current Outpatient Prescriptions on File Prior to Visit  Medication Sig Dispense Refill  . metFORMIN (GLUCOPHAGE) 500 MG tablet Take up to 2 pills at night  60 tablet  3   No current facility-administered medications on file prior to visit.     No Known Allergies   Immunization History  Administered Date(s) Administered  . Influenza,inj,Quad PF,36+ Mos 12/03/2012      Objective:   Physical Exam  Constitutional: She appears well-nourished. No distress.  HENT:  Head: Normocephalic.  Eyes: No scleral icterus.  Neck: No thyromegaly present.  Cardiovascular: Normal rate, regular rhythm and normal heart sounds.   Pulmonary/Chest: Effort normal and breath  sounds normal.  Abdominal: There is no tenderness.  Musculoskeletal: She exhibits no edema and no tenderness.  Neurological: She is alert.  Skin: Skin is warm and dry.  Psychiatric: She has a normal mood and affect. Her behavior is normal. Judgment and thought content normal.      Assessment & Plan:    Diane Mathis was seen today for medication management.  Diagnoses and associated orders for this visit:  IFG (impaired fasting glucose) Comments: Her A1c has increased from 6.0% to 6.1% so she is going to increase her metformin to 2000 mg per day.   - metFORMIN (GLUCOPHAGE) 1000 MG tablet; Take 1 tablet (1,000 mg total) by mouth 2 (two) times daily with a meal.  Other and unspecified hyperlipidemia Comments: She will get back on simvastatin.   - simvastatin (ZOCOR) 40 MG  tablet; Take 1 tablet (40 mg total) by mouth at bedtime.  [place] Comments: She is going to start on Clonidine to help her hot flashes.   - cloNIDine (CATAPRES) 0.1 MG tablet; Take 1 tablet (0.1 mg total) by mouth 2 (two) times daily.  Elevated BP Comments: She is to limit her sodium intake.   The clonidine will also lower her BP.   - cloNIDine (CATAPRES) 0.1 MG tablet; Take 1 tablet (0.1 mg total) by mouth 2 (two) times daily.   TIME SPENT "FACE TO FACE" WITH PATIENT -  30 MINS

## 2013-08-06 LAB — HM DIABETES EYE EXAM

## 2013-08-12 ENCOUNTER — Encounter: Payer: Self-pay | Admitting: *Deleted

## 2013-08-22 DIAGNOSIS — R030 Elevated blood-pressure reading, without diagnosis of hypertension: Secondary | ICD-10-CM

## 2013-08-22 DIAGNOSIS — IMO0001 Reserved for inherently not codable concepts without codable children: Secondary | ICD-10-CM | POA: Insufficient documentation

## 2013-08-22 DIAGNOSIS — R7301 Impaired fasting glucose: Secondary | ICD-10-CM | POA: Insufficient documentation

## 2013-08-22 DIAGNOSIS — R232 Flushing: Secondary | ICD-10-CM | POA: Insufficient documentation

## 2015-01-15 IMAGING — CR DG LUMBAR SPINE COMPLETE 4+V
5 series · 5 of 5 positions shown · non-contrast
Comparison: None.

CLINICAL DATA: Right lower leg pain/numbness

LUMBAR SPINE - COMPLETE 4+ VIEW

[t l-spine a.p.]
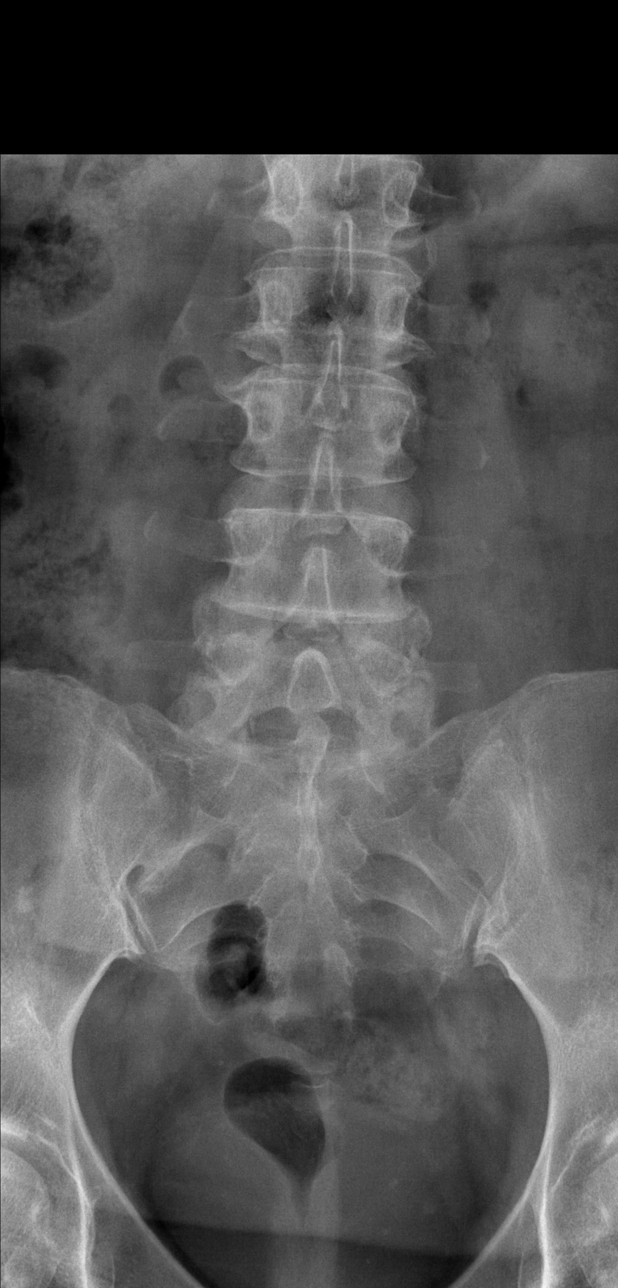

[t l-spine oblique exposure (1 of 2)]
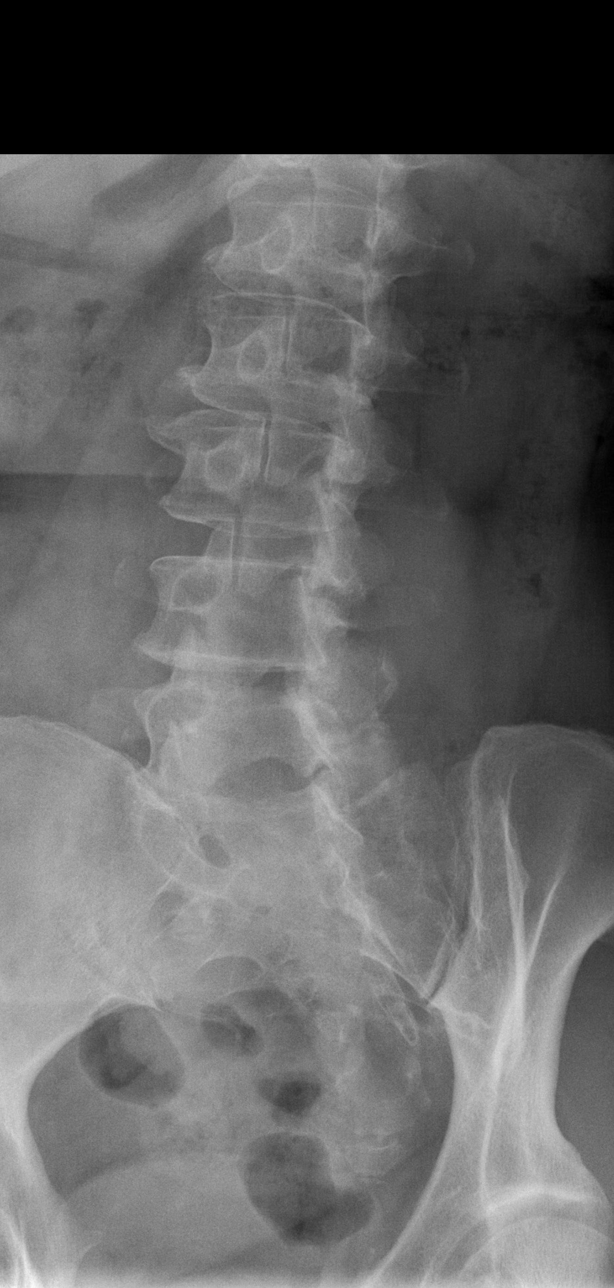

[t l-spine oblique exposure (2 of 2)]
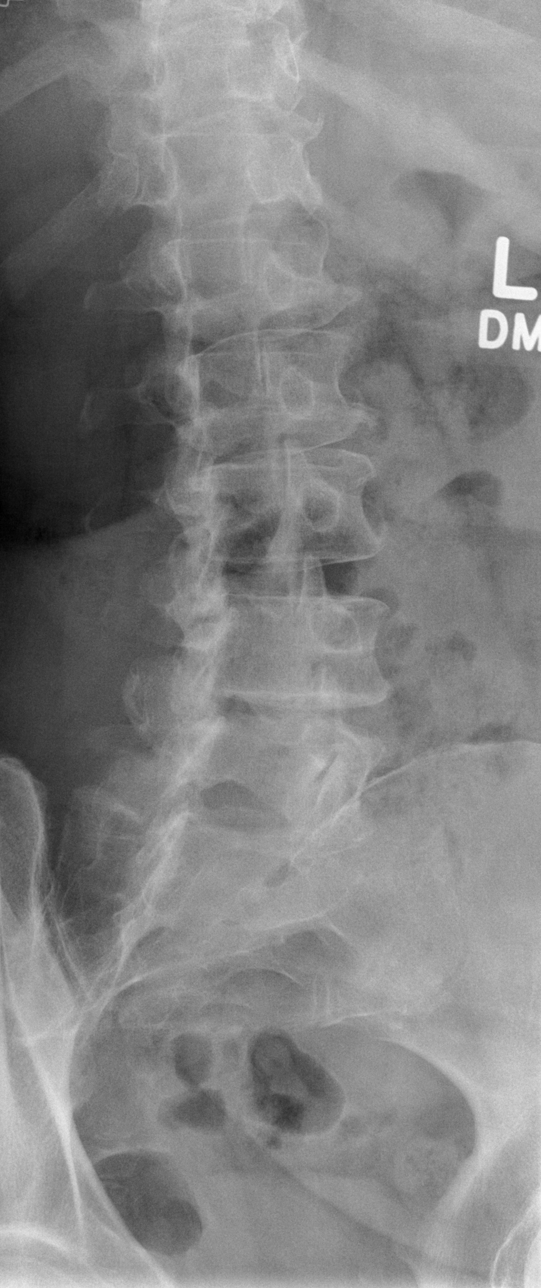

[t l-spine lat]
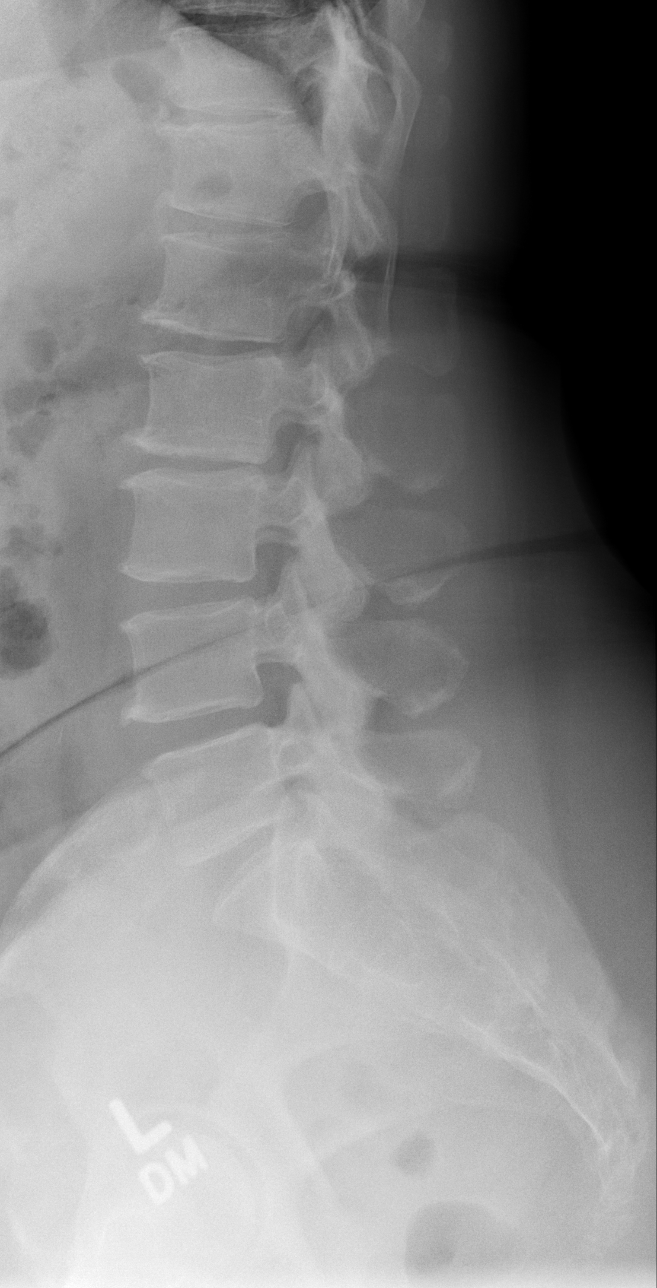

[t l-spine l5-s1 spot]
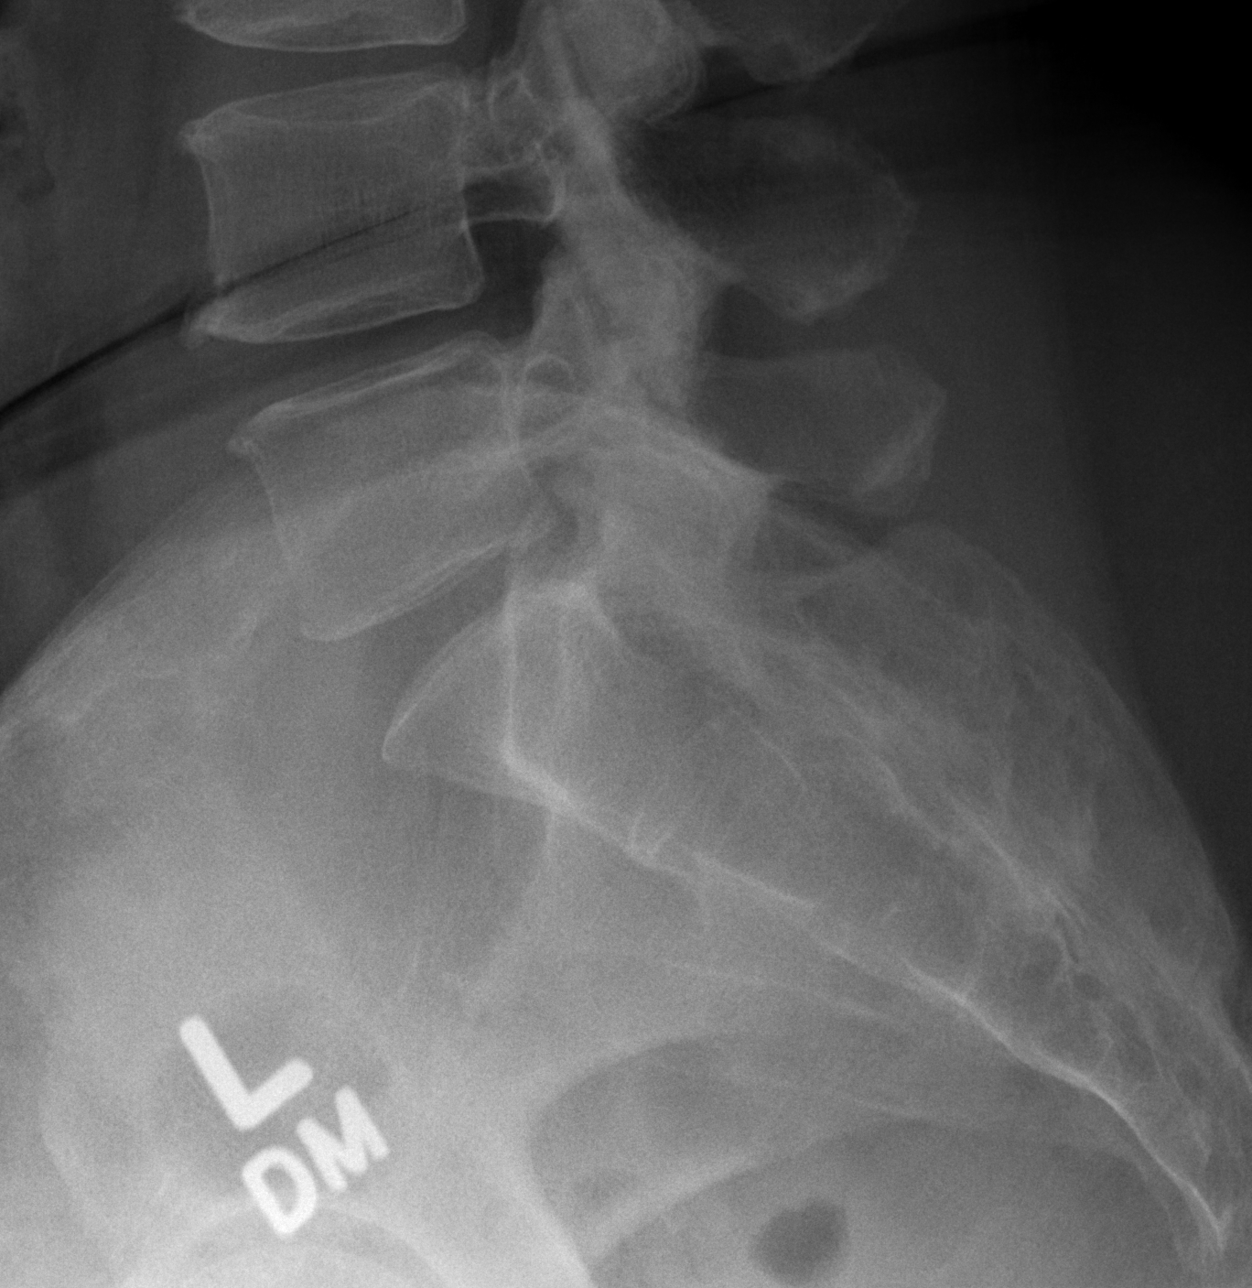

[5 of 5 positions shown; findings below may reference images not displayed]

FINDINGS: Five lumbar-type vertebral bodies.

Normal lumbar lordosis.

No evidence of fracture or dislocation.  Vertebral body heights are
maintained.

Mild to moderate multilevel degenerative changes.

Visualized bony pelvis appears intact.
IMPRESSION: No fracture or dislocation is seen.

Mild to moderate multilevel degenerative changes.

## 2015-03-27 IMAGING — CR DG FINGER MIDDLE 2+V*R*
3 series · 3 of 3 positions shown · non-contrast
Comparison: None.

CLINICAL DATA: Pain and swelling

RIGHT MIDDLE FINGER 2+V

[x finger pa right]
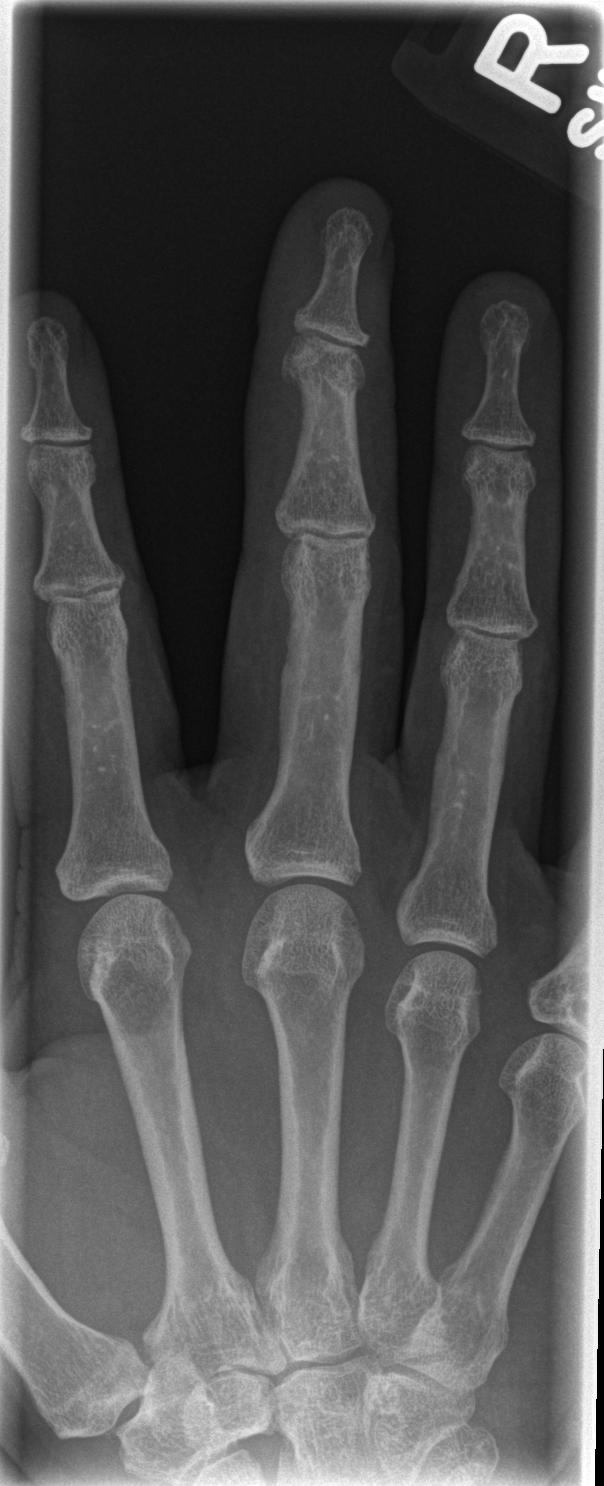

[x finger obl. right]
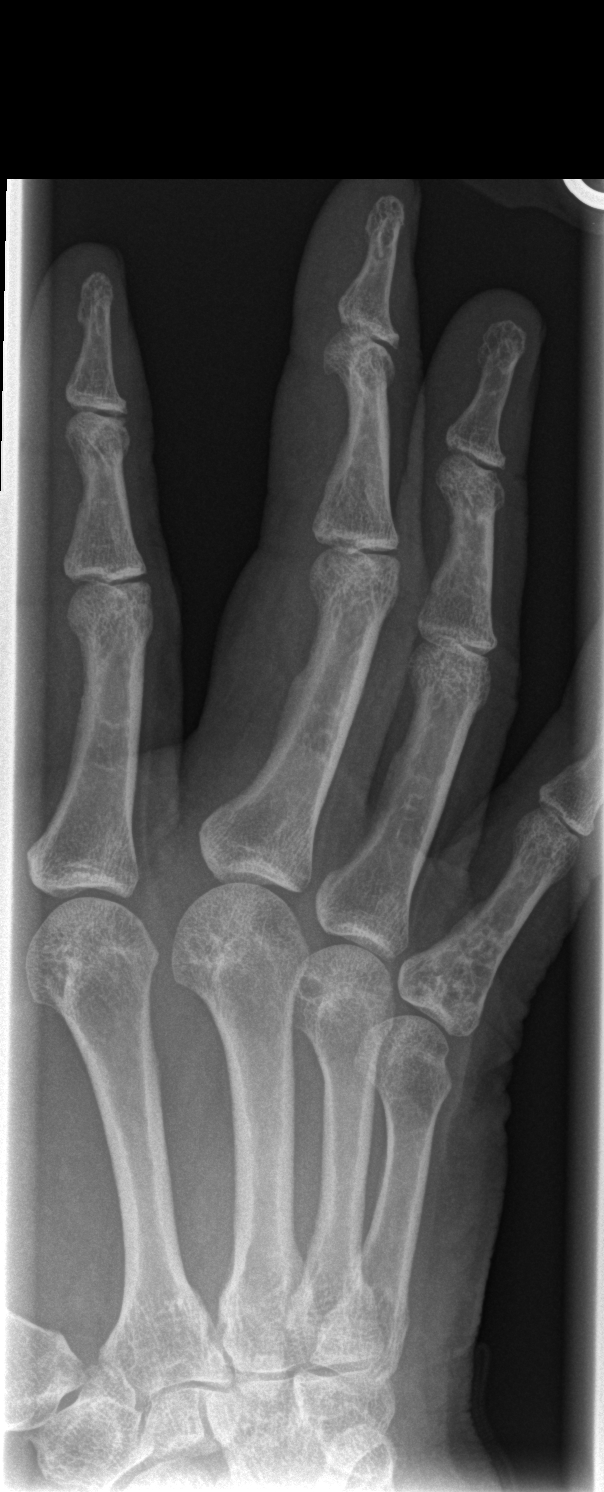

[x finger lateral right]
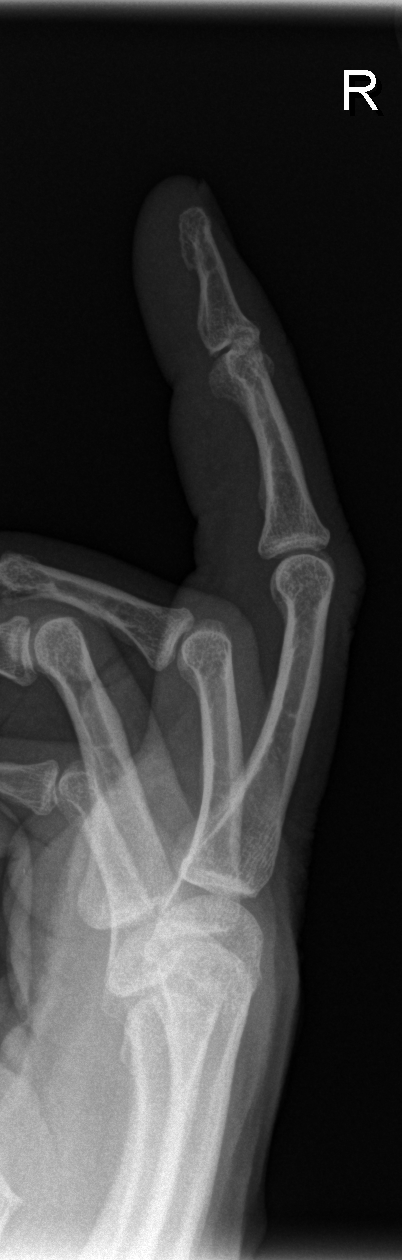

[3 of 3 positions shown; findings below may reference images not displayed]

FINDINGS: Frontal, oblique, and lateral views were obtained.  There
is generalized soft tissue swelling.  There is no fracture or
dislocation.  No erosive change or periostitis.  Joint spaces
appear intact.  There is no intra-articular calcifications.
IMPRESSION: Soft tissue swelling. Study otherwise unremarkable.
